# Patient Record
Sex: Female | Born: 2005 | Race: Asian | Hispanic: No | Marital: Single | Smoking: Never smoker
Health system: Southern US, Community
[De-identification: ages and names within clinical notes are randomized; demographics above are authoritative.]

---
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
## 2024-05-07 ENCOUNTER — Ambulatory Visit
Admission: RE | Admit: 2024-05-07 | Discharge: 2024-05-07 | Disposition: A | Discharge status: Home / Self Care | Payer: Self-pay | Source: Ambulatory Visit | Attending: Emergency Medicine | Admitting: Emergency Medicine

## 2024-05-07 VITALS — BP 105/73 | HR 97 | Temp 98.4°F | Resp 16

## 2024-05-07 DIAGNOSIS — S31135A Puncture wound of abdominal wall without foreign body, periumbilic region without penetration into peritoneal cavity, initial encounter: Secondary | ICD-10-CM

## 2024-05-07 DIAGNOSIS — L089 Local infection of the skin and subcutaneous tissue, unspecified: Secondary | ICD-10-CM

## 2024-05-07 MED ORDER — BACITRACIN 500 UNIT/GM EX OINT
1.0000 | TOPICAL_OINTMENT | Freq: Two times a day (BID) | CUTANEOUS | Status: DC
  Administered 2024-05-07: 1 via TOPICAL

## 2024-05-07 MED ORDER — MUPIROCIN 2 % EX OINT: 1.0000 | TOPICAL_OINTMENT | Freq: Two times a day (BID) | CUTANEOUS | 0 refills | Status: AC

## 2024-05-07 MED ORDER — CEPHALEXIN 500 MG PO CAPS: 500.0000 mg | ORAL_CAPSULE | Freq: Four times a day (QID) | ORAL | 0 refills | Status: AC

## 2024-05-07 NOTE — ED Provider Notes (Signed)
 [facility]    CSN: [id number] Arrival date & time: 05/07/24  1001      History   Chief Complaint Chief Complaint  Patient presents with   Allergic Reaction    Rash around [facility] - Entered by patient    HPI Stacie Hogan is a 18 y.o. female.   Stacie Hogan, 18 year old female, presents to urgent care for evaluation of rash around bellybutton.  Patient states she recently got her bellybutton pierced 2 weeks ago and developed a rash 3 days prior.  Now the piercing is becoming tender , patient denies any previous issues.  Patient denies any fever, nausea vomiting, purulent drainage.  Patient has nonstick covering to bellybutton area.  The history is provided by the patient. No language interpreter was used.    History reviewed. No pertinent past medical history.  Patient Active Problem List   Diagnosis Date Noted   Infected pierced belly button 05/07/2024   Adjustment disorder with mixed anxiety and depressed mood 03/12/2021    History reviewed. No pertinent surgical history.  OB History   No obstetric history on file.      Home Medications    Prior to Admission medications   Medication Sig Start Date End Date Taking? Authorizing Provider  cephALEXin (KEFLEX) 500 MG capsule Take 1 capsule (500 mg total) by mouth 4 (four) times daily for 7 days. 05/07/24 05/14/24 Yes Sapir Lavey, NP  mupirocin ointment (BACTROBAN) 2 % Apply 1 Application topically 2 (two) times daily for 7 days. Right wrist 05/07/24 05/14/24 Yes Ashani Pumphrey, Rilla, NP    Family History Family History  Problem Relation Age of Onset   Thyroid nodules Mother    Hypothyroidism Brother    Hyperthyroidism Maternal Aunt    Diabetes Mellitus II Maternal Grandfather    Hypertension Maternal Grandfather     Social History Social History   Tobacco Use   Smoking status: Never   Smokeless tobacco: Never     Allergies   Other   Review of Systems Review of Systems   Constitutional:  Negative for fever.  Skin:  Positive for color change and rash.  All other systems reviewed and are negative.    Physical Exam Triage Vital Signs ED Triage Vitals  Encounter Vitals Group     BP 05/07/24 1012 105/73     Girls Systolic BP Percentile --      Girls Diastolic BP Percentile --      Boys Systolic BP Percentile --      Boys Diastolic BP Percentile --      Pulse Rate 05/07/24 1012 97     Resp 05/07/24 1012 16     Temp 05/07/24 1012 98.4 F (36.9 C)     Temp Source 05/07/24 1012 Oral     SpO2 05/07/24 1012 92 %     Weight --      Height --      Head Circumference --      Peak Flow --      Pain Score 05/07/24 1015 4     Pain Loc --      Pain Education --      Exclude from Growth Chart --    No data found.  Updated Vital Signs BP 105/73 (BP Location: Right Arm)   Pulse 97   Temp 98.4 F (36.9 C) (Oral)   Resp 16   LMP 05/03/2024 (Exact Date)   SpO2 92%   Visual Acuity Right Eye Distance:   Left Eye  Distance:   Bilateral Distance:    Right Eye Near:   Left Eye Near:    Bilateral Near:     Physical Exam Vitals and nursing note reviewed.  Constitutional:      Appearance: Normal appearance. She is well-developed and well-groomed.  HENT:     Head: Normocephalic.   Cardiovascular:     Rate and Rhythm: Normal rate.  Pulmonary:     Effort: Pulmonary effort is normal.  Abdominal:     Palpations: Abdomen is soft.     Tenderness: There is abdominal tenderness.   Skin:    General: Skin is warm.     Capillary Refill: Capillary refill takes less than 2 seconds.     Findings: Erythema, rash and wound present. Rash is macular and papular.       Neurological:     General: No focal deficit present.     Mental Status: She is alert and oriented to person, place, and time.   Psychiatric:        Attention and Perception: Attention normal.        Mood and Affect: Mood normal.        Speech: Speech normal.        Behavior: Behavior  normal. Behavior is cooperative.        UC Treatments / Results  Labs (all labs ordered are listed, but only abnormal results are displayed) Labs Reviewed - No data to display  EKG   Radiology No results found.  Procedures Procedures (including critical care time)  Medications Ordered in UC Medications  bacitracin ointment 1 Application (1 Application Topical Given 05/07/24 1052)    Initial Impression / Assessment and Plan / UC Course  I have reviewed the triage vital signs and the nursing notes.  Pertinent labs & imaging results that were available during my care of the patient were reviewed by me and considered in my medical decision making (see chart for details).    Discussed exam findings and plan of care with patient, bellybutton ring removed by this provider and given to patient , will treat with mupirocin and Keflex, strict go to ER precautions given, patient verbalized understanding to this provider, wound care provided in wound covered by staff.  Patient tolerated well  Ddx: Infected pierced bellybutton, rash, cellulitis Final Clinical Impressions(s) / UC Diagnoses   Final diagnoses:  Infected pierced belly button     Discharge Instructions      Take antibiotic as directed, daily dressing changes with mupirocin, apply nonstick Telfa dressing to the area.  Call your PCP and schedule a wound recheck in 3 days, sooner if worse.  If you develop nausea ,vomiting, unable to take antibiotic , or worsening symptoms go immediately to the emergency room for further evaluation.      ED Prescriptions     Medication Sig Dispense Auth. Provider   cephALEXin (KEFLEX) 500 MG capsule Take 1 capsule (500 mg total) by mouth 4 (four) times daily for 7 days. 28 capsule Markala Sitts, NP   mupirocin ointment (BACTROBAN) 2 % Apply 1 Application topically 2 (two) times daily for 7 days. Right wrist 15 g Maxson Oddo, Rilla, NP      PDMP not reviewed this encounter.    Aminta Rilla, NP 05/07/24 1239

## 2024-05-07 NOTE — Discharge Instructions (Signed)
 Take antibiotic as directed, daily dressing changes with mupirocin, apply nonstick Telfa dressing to the area.  Call your PCP and schedule a wound recheck in 3 days, sooner if worse.  If you develop nausea ,vomiting, unable to take antibiotic , or worsening symptoms go immediately to the emergency room for further evaluation.

## 2024-05-07 NOTE — ED Triage Notes (Signed)
 Pt presents with rash around belly button. She got belly button pierced two weeks ago and rash developed last Friday. States piercing is now becoming tender.  Denies previous complications with piercing.
